# Patient Record
Sex: Male | Born: 1970 | Race: White | Hispanic: No | Marital: Married | Smoking: Current every day smoker
Health system: Southern US, Community
[De-identification: ages and names within clinical notes are randomized; demographics above are authoritative.]

## PROBLEM LIST (undated history)

## (undated) HISTORY — PX: TONSILLECTOMY: SUR1361

---

## 1999-07-31 ENCOUNTER — Inpatient Hospital Stay (HOSPITAL_COMMUNITY): Admission: EM | Admit: 1999-07-31 | Discharge: 1999-08-05 | Payer: Self-pay | Admitting: Emergency Medicine

## 2011-11-26 ENCOUNTER — Ambulatory Visit: Payer: Self-pay | Admitting: Physician Assistant

## 2013-08-28 ENCOUNTER — Ambulatory Visit: Payer: Self-pay | Admitting: Orthopedic Surgery

## 2013-09-01 LAB — WOUND CULTURE

## 2015-03-14 NOTE — Op Note (Signed)
PATIENT NAME:  Donald Henson, [name] MR#:  [id number] DATE OF BIRTH:  1971-08-04  DATE OF PROCEDURE:  08/28/2013  PREOPERATIVE DIAGNOSIS: Left middle finger abscess with foreign body.   POSTOPERATIVE DIAGNOSIS: Left middle finger abscess with foreign body.   PROCEDURE: Removal of foreign body and drainage of abscess, left little finger.   ANESTHESIA: Digital block with MAC.   SURGEON: Leitha SchullerMichael J. Lizza Huffaker, M.D.   DESCRIPTION OF PROCEDURE: The patient was brought to the operating room and after timeout procedure was completed and adequate IV sedation given, a digital block was given with 10 mL of 0.5% Sensorcaine plain injected into the area of the metacarpal neck, 10 mL on each radial and ulnar sides to provide analgesia for the procedure as well as postop analgesia. The hand was then prepped and draped in the usual sterile fashion. After patient identification and timeout procedures were completed, a Penrose drain was placed at the base of the finger as a tourniquet. Mini C-arm was used during the procedure to localize the foreign body. A small incision was made over the foreign body in the pulp and the subcutaneous tissue spread. There was purulent material present, and a culture was obtained of this material. Using fluoroscopy as a guide with a hemostat, the small metallic fragment was removed. On direct pressure to the wound, what appeared to be a wall to the abscess was expressed with fibrous tissue. The wound was thoroughly irrigated and left open to allow for drainage. The Penrose drain was removed, and there was minimal bleeding. There did not appear to be any further purulent material present, and there appeared to be adequate drainage of the abscess. Sterile dressing with Xeroform, 4 x 4's and a 1 inch Kling applied. The patient was then sent to the recovery room in stable condition.   ESTIMATED BLOOD LOSS: Minimal.   COMPLICATIONS: None.   SPECIMEN: Culture from the left middle finger abscess.     ____________________________ Leitha SchullerMichael J. Shaquel Josephson, MD mjm:gb D: 08/28/2013 21:09:42 ET T: 08/28/2013 22:13:14 ET JOB#: [id number]  cc: Leitha SchullerMichael J. Ambre Kobayashi, MD, <Dictator> Leitha SchullerMICHAEL J Norine Reddington MD ELECTRONICALLY SIGNED 08/29/2013 8:03

## 2017-07-26 ENCOUNTER — Emergency Department
Admission: EM | Admit: 2017-07-26 | Discharge: 2017-07-26 | Disposition: A | Discharge status: Home / Self Care | Payer: Worker's Compensation | Attending: Emergency Medicine | Admitting: Emergency Medicine

## 2017-07-26 ENCOUNTER — Emergency Department: Payer: Worker's Compensation

## 2017-07-26 ENCOUNTER — Encounter: Payer: Self-pay | Admitting: Emergency Medicine

## 2017-07-26 DIAGNOSIS — S6710XA Crushing injury of unspecified finger(s), initial encounter: Secondary | ICD-10-CM

## 2017-07-26 DIAGNOSIS — Y929 Unspecified place or not applicable: Secondary | ICD-10-CM | POA: Insufficient documentation

## 2017-07-26 DIAGNOSIS — Y939 Activity, unspecified: Secondary | ICD-10-CM | POA: Insufficient documentation

## 2017-07-26 DIAGNOSIS — F17200 Nicotine dependence, unspecified, uncomplicated: Secondary | ICD-10-CM | POA: Diagnosis not present

## 2017-07-26 DIAGNOSIS — S61214A Laceration without foreign body of right ring finger without damage to nail, initial encounter: Secondary | ICD-10-CM | POA: Diagnosis not present

## 2017-07-26 DIAGNOSIS — S67196A Crushing injury of right little finger, initial encounter: Secondary | ICD-10-CM | POA: Diagnosis present

## 2017-07-26 DIAGNOSIS — W231XXA Caught, crushed, jammed, or pinched between stationary objects, initial encounter: Secondary | ICD-10-CM | POA: Diagnosis not present

## 2017-07-26 DIAGNOSIS — S62666A Nondisplaced fracture of distal phalanx of right little finger, initial encounter for closed fracture: Secondary | ICD-10-CM | POA: Insufficient documentation

## 2017-07-26 DIAGNOSIS — Y990 Civilian activity done for income or pay: Secondary | ICD-10-CM | POA: Insufficient documentation

## 2017-07-26 DIAGNOSIS — S61316A Laceration without foreign body of right little finger with damage to nail, initial encounter: Secondary | ICD-10-CM

## 2017-07-26 DIAGNOSIS — S62639B Displaced fracture of distal phalanx of unspecified finger, initial encounter for open fracture: Secondary | ICD-10-CM

## 2017-07-26 MED ORDER — BUPIVACAINE HCL (PF) 0.5 % IJ SOLN
20.0000 mL | Freq: Once | INTRAMUSCULAR | Status: AC
  Administered 2017-07-26: 20 mL
  Filled 2017-07-26: qty 30

## 2017-07-26 MED ORDER — TETANUS-DIPHTH-ACELL PERTUSSIS 5-2.5-18.5 LF-MCG/0.5 IM SUSP
0.5000 mL | Freq: Once | INTRAMUSCULAR | Status: AC
  Administered 2017-07-26: 0.5 mL via INTRAMUSCULAR

## 2017-07-26 MED ORDER — TETANUS-DIPHTHERIA TOXOIDS TD 5-2 LFU IM INJ
0.5000 mL | INJECTION | Freq: Once | INTRAMUSCULAR | Status: DC
  Filled 2017-07-26: qty 0.5

## 2017-07-26 MED ORDER — HYDROCODONE-ACETAMINOPHEN 5-325 MG PO TABS
1.0000 | ORAL_TABLET | Freq: Once | ORAL | Status: AC
  Administered 2017-07-26: 1 via ORAL
  Filled 2017-07-26: qty 1

## 2017-07-26 MED ORDER — CEPHALEXIN 500 MG PO CAPS
500.0000 mg | ORAL_CAPSULE | Freq: Once | ORAL | Status: AC
  Administered 2017-07-26: 500 mg via ORAL
  Filled 2017-07-26: qty 1

## 2017-07-26 MED ORDER — CEPHALEXIN 500 MG PO CAPS: 500.0000 mg | ORAL_CAPSULE | Freq: Four times a day (QID) | ORAL | 0 refills | Status: AC

## 2017-07-26 MED ORDER — LIDOCAINE HCL (PF) 1 % IJ SOLN
5.0000 mL | Freq: Once | INTRAMUSCULAR | Status: AC
  Administered 2017-07-26: 5 mL via INTRADERMAL
  Filled 2017-07-26: qty 5

## 2017-07-26 MED ORDER — TETANUS-DIPHTH-ACELL PERTUSSIS 5-2.5-18.5 LF-MCG/0.5 IM SUSP
INTRAMUSCULAR | Status: AC
  Filled 2017-07-26: qty 0.5

## 2017-07-26 MED ORDER — BACITRACIN ZINC 500 UNIT/GM EX OINT
1.0000 | TOPICAL_OINTMENT | Freq: Two times a day (BID) | CUTANEOUS | Status: DC
Start: 2017-07-26 — End: 2017-07-27
  Administered 2017-07-26: 1 via TOPICAL
  Filled 2017-07-26: qty 0.9

## 2017-07-26 MED ORDER — HYDROCODONE-ACETAMINOPHEN 5-325 MG PO TABS: 1.0000 | ORAL_TABLET | ORAL | 0 refills | Status: AC | PRN

## 2017-07-26 NOTE — Discharge Instructions (Signed)
It is very important that you take the antibiotic and follow up with the hand specialist.  Do not get the sutured area wet for 24 hours. After 24 hours, shower/bathe as usual and pat the area dry. Change the bandage 2 times per day and apply antibiotic ointment. Wear the splint until you are advised otherwise by the specialist.  Return to the ER for symptoms that change or worsen if unable to schedule an appointment.

## 2017-07-26 NOTE — ED Triage Notes (Signed)
Patient cut his finger on a mechanical truck left at work. Patient with laceration to right fifth finger.

## 2017-07-26 NOTE — ED Notes (Signed)
UDS and breath analysis completed. Walked down to lab and paperwork placed on chart.

## 2017-07-26 NOTE — ED Provider Notes (Signed)
[facility] Emergency Department Provider Note  ____________________________________________  Time seen: Approximately 7:26 PM  I have reviewed the triage vital signs and the nursing notes.   HISTORY  Chief Complaint Laceration   HPI Donald Henson is a 46 y.o. male who presents to the emergency department for treatment and evaluation of an injury to his pinky finger on his right hand.He states that he cut it his finger on a mechanical truck lift at work. Tip of finger is nearly off per report.   History reviewed. No pertinent past medical history.  There are no active problems to display for this patient.   History reviewed. No pertinent surgical history.  Prior to Admission medications   Medication Sig Start Date End Date Taking? Authorizing Provider  cephALEXin (KEFLEX) 500 MG capsule Take 1 capsule (500 mg total) by mouth 4 (four) times daily. 07/26/17 08/05/17  Haron Beilke, Rulon Eisenmengerari B, FNP  HYDROcodone-acetaminophen (NORCO/VICODIN) 5-325 MG tablet Take 1 tablet by mouth every 4 (four) hours as needed for moderate pain. 07/26/17 07/26/18  Kem Boroughsriplett, Dallen Bunte B, FNP    Allergies Sulfa antibiotics  No family history on file.  Social History Social History  Substance Use Topics  . Smoking status: Current Every Day Smoker  . Smokeless tobacco: Never Used  . Alcohol use Not on file    Review of Systems  Constitutional: Negative for fever. Respiratory: Negative for cough or shortness of breath.  Musculoskeletal: Positive for right hand pain. Skin: Positive for laceration Neurological: Negative for numbness or paresthesias. ____________________________________________   PHYSICAL EXAM:  VITAL SIGNS: ED Triage Vitals  Enc Vitals Group     BP 07/26/17 1915 (!) 147/90     Pulse Rate 07/26/17 1915 91     Resp 07/26/17 1915 18     Temp 07/26/17 1915 98.1 F (36.7 C)     Temp Source 07/26/17 1915 Oral     SpO2 07/26/17 1915 100 %     Weight 07/26/17 1916  182 lb (82.6 kg)     Height 07/26/17 1916 5\' 9"  (1.753 m)     Head Circumference --      Peak Flow --      Pain Score 07/26/17 1915 4     Pain Loc --      Pain Edu? --      Excl. in GC? --      Constitutional: Well appearing. Eyes: Conjunctivae are clear without discharge or drainage. Nose: No rhinorrhea noted. Mouth/Throat: Airway is patent.  Neck: No stridor. Unrestricted range of motion observed.  Cardiovascular: Capillary refill is <3 seconds.  Respiratory: Respirations are even and unlabored.. Musculoskeletal: Right small fingertip nearly avulsed from under nail to the lateral aspect just above DIP. Hand and fingers have full ROM. Neurologic: Awake, alert, and oriented x 4.  Skin:  Laceration to right 5th fingertip.  ____________________________________________   LABS (all labs ordered are listed, but only abnormal results are displayed)  Labs Reviewed - No data to display ____________________________________________  EKG  Not indicated. ____________________________________________  RADIOLOGY  Comminuted fracture distal aspect fifth distal phalanx with extensive soft tissue injury. I, Kem Boroughsari Westyn Keatley, personally viewed and evaluated these images (plain radiographs) as part of my medical decision making, as well as reviewing the written report by the radiologist.  ___________________________________________   PROCEDURES  Procedure(s) performed:  LACERATION REPAIR Performed by: Kem Boroughsari Mal Asher  Consent: Verbal consent obtained.  Consent given by: patient  Prepped and Draped in normal sterile fashion  Wound explored: No  foreign bodies identified  Laceration Location: right pinky finger  Laceration Length: 3cm  Anesthesia: digital block  Local anesthetic: lidocaine 1% with bupivicaine 0.5  Anesthetic total: 6 ml  Irrigation method: syringe  Amount of cleaning: Copious  Skin closure: simple interrupted.  Number of sutures: 8  Technique: 4-0  vicryl; 5-0 nylon  Patient tolerance: Patient tolerated the procedure well with no immediate complications.    ____________________________________________   INITIAL IMPRESSION / ASSESSMENT AND PLAN / ED COURSE  Donald Henson is a 46 y.o. male who presents to the emergency department for evaluation and treatment of dominant hand crush injury and laceration on the small finger. Wound was repaired and tetanus was updated. He is to take keflex as prescribed. He was given strict instructions to follow up with Dr. Stephenie Acres and advised that he will need follow up to make sure the fingertip is viable.   Pertinent labs & imaging results that were available during my care of the patient were reviewed by me and considered in my medical decision making (see chart for details). ____________________________________________   FINAL CLINICAL IMPRESSION(S) / ED DIAGNOSES  Final diagnoses:  Laceration of right little finger without foreign body with damage to nail, initial encounter  Crush injury to finger, initial encounter  Open fracture of tuft of distal phalanx of finger    Discharge Medication List as of 07/26/2017  9:24 PM    START taking these medications   Details  cephALEXin (KEFLEX) 500 MG capsule Take 1 capsule (500 mg total) by mouth 4 (four) times daily., Starting Tue 07/26/2017, Until Fri 08/05/2017, Print    HYDROcodone-acetaminophen (NORCO/VICODIN) 5-325 MG tablet Take 1 tablet by mouth every 4 (four) hours as needed for moderate pain., Starting Tue 07/26/2017, Until Wed 07/26/2018, Print        If controlled substance prescribed during this visit, 12 month history viewed on the [facility] prior to issuing an initial prescription for Schedule II or III opiod.   Note:  This document was prepared using Dragon voice recognition software and may include unintentional dictation errors.    Chinita Pester, FNP 07/26/17 [phone]    Phineas Semen, MD 07/26/17 [phone]

## 2018-08-04 IMAGING — DX DG FINGER LITTLE 2+V*R*
3 series · 3 of 3 positions shown · non-contrast
Comparison: None.

CLINICAL DATA: Laceration on piece of metal

EXAM:
RIGHT FIFTH FINGER 2+V

[finger ap]
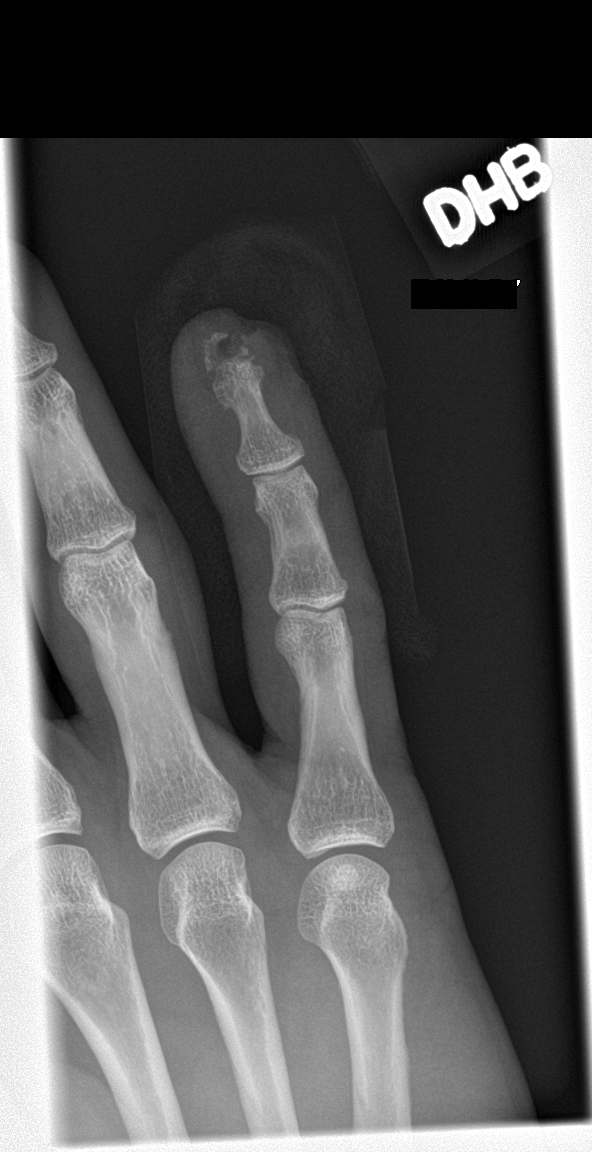

[finger obl]
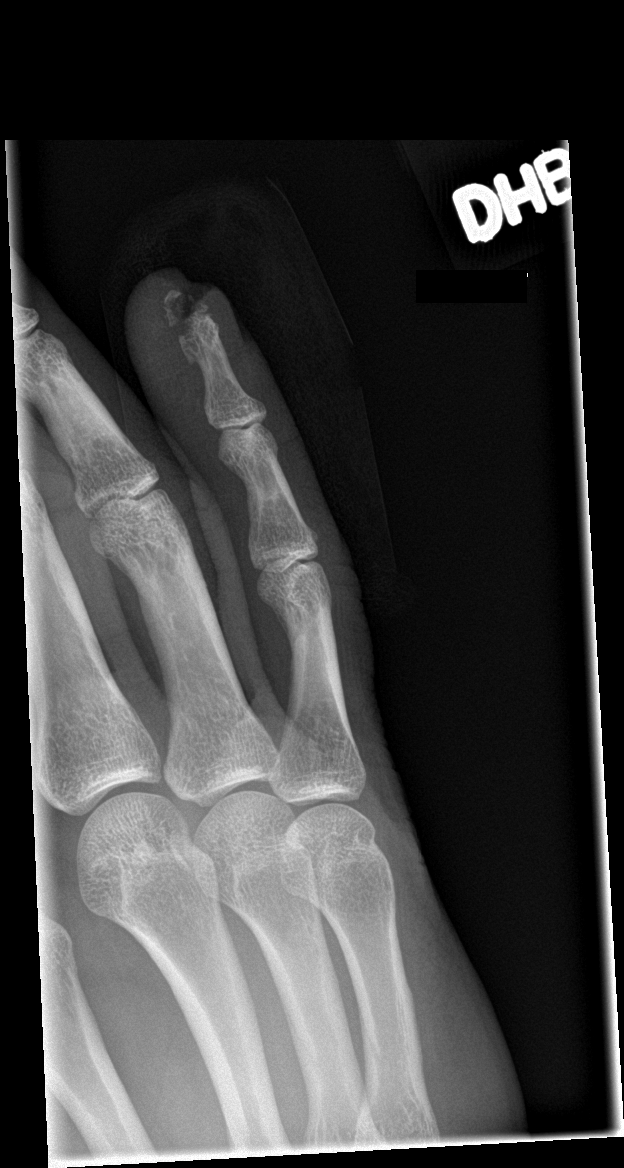

[finger lat]
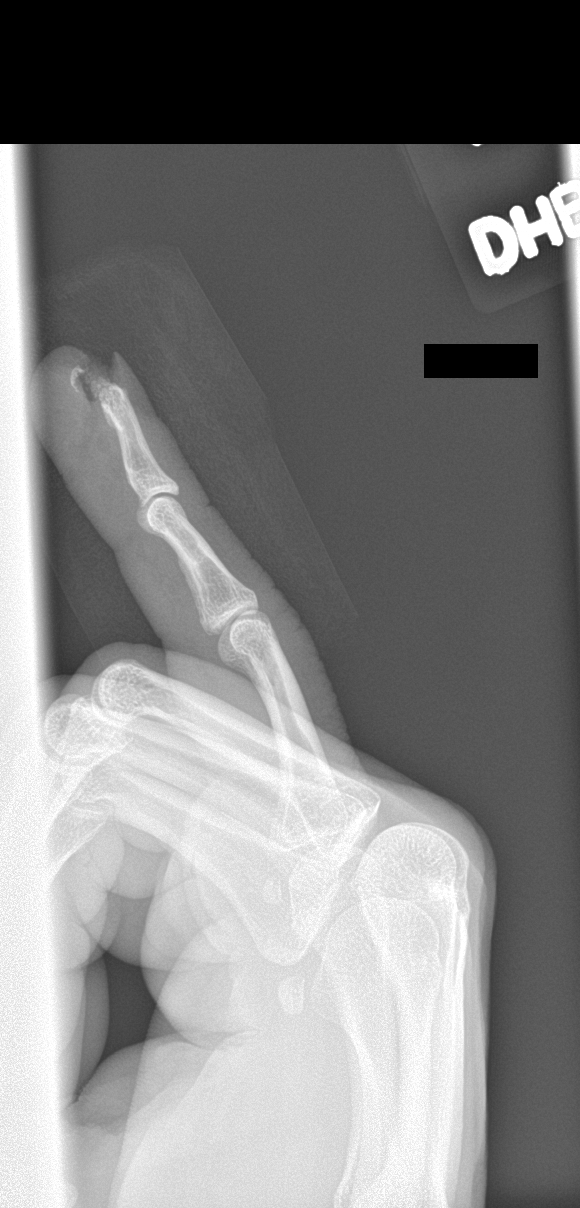

[3 of 3 positions shown; findings below may reference images not displayed]

FINDINGS: Frontal, oblique, and lateral views were obtained. There is soft
tissue injury to the distal aspect of the fifth digit. There is a
comminuted fracture of the distal aspect of the fifth distal phalanx
with displacement of fracture fragments. There is ungual injury as
well. No other fractures. No dislocation. No appreciable joint space
narrowing or erosion.
IMPRESSION: Comminuted fracture distal aspect fifth distal phalanx with
extensive soft tissue injury. No other fracture. No dislocation. No
appreciable arthropathy.

## 2021-04-22 DEATH — deceased

## 2024-10-15 ENCOUNTER — Ambulatory Visit: Payer: Self-pay | Admitting: Surgery

## 2024-10-15 NOTE — H&P (View-Only) (Signed)
 Subjective:   CC: Umbilical hernia with obstruction, without gangrene [K42.0]  HPI: referred by Jonette Penning Minor,* for evaluation of above.   History of Present Illness Donald Henson is a 53 year old male who presents with a belly button hernia. He was referred by his primary care doctor for evaluation of his belly button hernia.  He has a persistent umbilical hernia that has become increasingly irritated and red. It has never fully reduced. He has had no prior abdominal surgeries.  He smokes about half a pack per day and drinks a twelve-pack of alcohol on weekends. He does not use recreational drugs.  He takes daily allergy medication. He has coughing spells that can cause gagging and vomiting.   Past Medical History:  has no past medical history on file.  Past Surgical History: No past surgical history on file.  Family History: family history includes No Known Problems in his father and mother.  Social History:  reports that he has been smoking cigarettes. He has never used smokeless tobacco. He reports that he does not currently use alcohol after a past usage of about 12.0 standard drinks of alcohol per week. He reports that he does not use drugs.  Current Medications: has a current medication list which includes the following prescription(s): amlodipine, losartan-hydrochlorothiazide, mupirocin, and rosuvastatin.  Allergies:  Allergies as of 10/15/2024 - Reviewed 10/15/2024  Allergen Reaction Noted   Sulfa (sulfonamide antibiotics) Swelling 10/12/2016    ROS:  A 15 point review of systems was performed and pertinent positives and negatives noted in HPI   Objective:     BP (!) 157/84   Pulse 85   Ht 172.7 cm (5' 8)   Wt 96.2 kg (212 lb)   BMI 32.23 kg/m   Constitutional :  Alert, cooperative, no distress  Lymphatics/Throat:  Supple, no lymphadenopathy  Respiratory:  clear to auscultation bilaterally  Cardiovascular:  regular rate and rhythm   Gastrointestinal: soft, non-tender; bowel sounds normal; no masses,  no organomegaly. umbilical hernia noted.  moderate, incarcerated, and irritated skin  Musculoskeletal: Steady gait and movement  Skin: Cool and moist  Psychiatric: Normal affect, non-agitated, not confused       LABS:  N/a   RADS: N/a Assessment:       Umbilical hernia with obstruction, without gangrene [K42.0]  Plan:     1. Umbilical hernia with obstruction, without gangrene [K42.0]   Discussed the risk of surgery including recurrence, which can be up to 50% in the case of incisional or complex hernias, possible use of prosthetic materials (mesh) and the increased risk of mesh infxn if used, bleeding, chronic pain, post-op infxn, post-op SBO or ileus, and possible re-operation to address said risks. The risks of general anesthetic, if used, includes MI, CVA, sudden death or even reaction to anesthetic medications also discussed. Alternatives include continued observation.  Benefits include possible symptom relief, prevention of incarceration, strangulation, enlargement in size over time, and the risk of emergency surgery in the face of strangulation.   Typical post-op recovery time of 3-5 days with 2 weeks of activity restrictions were also discussed.  ED return precautions given for sudden increase in pain, size of hernia with accompanying fever, nausea, and/or vomiting.  The patient verbalized understanding and all questions were answered to the patient's satisfaction.   2. Patient has elected to proceed with surgical treatment. Procedure will be scheduled.    Umbilical hernia with obstruction, without gangrene [K42.0], robotic assisted laparoscopic [phone]  labs/images/medications/previous chart entries reviewed  personally and relevant changes/updates noted above.

## 2024-10-15 NOTE — H&P (Signed)
 Subjective:   CC: Umbilical hernia with obstruction, without gangrene [K42.0]  HPI: referred by Jonette Penning Minor,* for evaluation of above.   History of Present Illness Donald Henson is a 53 year old male who presents with a belly button hernia. He was referred by his primary care doctor for evaluation of his belly button hernia.  He has a persistent umbilical hernia that has become increasingly irritated and red. It has never fully reduced. He has had no prior abdominal surgeries.  He smokes about half a pack per day and drinks a twelve-pack of alcohol on weekends. He does not use recreational drugs.  He takes daily allergy medication. He has coughing spells that can cause gagging and vomiting.   Past Medical History:  has no past medical history on file.  Past Surgical History: No past surgical history on file.  Family History: family history includes No Known Problems in his father and mother.  Social History:  reports that he has been smoking cigarettes. He has never used smokeless tobacco. He reports that he does not currently use alcohol after a past usage of about 12.0 standard drinks of alcohol per week. He reports that he does not use drugs.  Current Medications: has a current medication list which includes the following prescription(s): amlodipine, losartan-hydrochlorothiazide, mupirocin, and rosuvastatin.  Allergies:  Allergies as of 10/15/2024 - Reviewed 10/15/2024  Allergen Reaction Noted   Sulfa (sulfonamide antibiotics) Swelling 10/12/2016    ROS:  A 15 point review of systems was performed and pertinent positives and negatives noted in HPI   Objective:     BP (!) 157/84   Pulse 85   Ht 172.7 cm (5' 8)   Wt 96.2 kg (212 lb)   BMI 32.23 kg/m   Constitutional :  Alert, cooperative, no distress  Lymphatics/Throat:  Supple, no lymphadenopathy  Respiratory:  clear to auscultation bilaterally  Cardiovascular:  regular rate and rhythm   Gastrointestinal: soft, non-tender; bowel sounds normal; no masses,  no organomegaly. umbilical hernia noted.  moderate, incarcerated, and irritated skin  Musculoskeletal: Steady gait and movement  Skin: Cool and moist  Psychiatric: Normal affect, non-agitated, not confused       LABS:  N/a   RADS: N/a Assessment:       Umbilical hernia with obstruction, without gangrene [K42.0]  Plan:     1. Umbilical hernia with obstruction, without gangrene [K42.0]   Discussed the risk of surgery including recurrence, which can be up to 50% in the case of incisional or complex hernias, possible use of prosthetic materials (mesh) and the increased risk of mesh infxn if used, bleeding, chronic pain, post-op infxn, post-op SBO or ileus, and possible re-operation to address said risks. The risks of general anesthetic, if used, includes MI, CVA, sudden death or even reaction to anesthetic medications also discussed. Alternatives include continued observation.  Benefits include possible symptom relief, prevention of incarceration, strangulation, enlargement in size over time, and the risk of emergency surgery in the face of strangulation.   Typical post-op recovery time of 3-5 days with 2 weeks of activity restrictions were also discussed.  ED return precautions given for sudden increase in pain, size of hernia with accompanying fever, nausea, and/or vomiting.  The patient verbalized understanding and all questions were answered to the patient's satisfaction.   2. Patient has elected to proceed with surgical treatment. Procedure will be scheduled.    Umbilical hernia with obstruction, without gangrene [K42.0], robotic assisted laparoscopic [phone]  labs/images/medications/previous chart entries reviewed  personally and relevant changes/updates noted above.

## 2024-11-02 ENCOUNTER — Other Ambulatory Visit: Payer: Self-pay

## 2024-11-02 ENCOUNTER — Inpatient Hospital Stay: Admission: RE | Admit: 2024-11-02 | Discharge: 2024-11-02 | Payer: Self-pay | Attending: Surgery

## 2024-11-02 VITALS — Ht 68.0 in | Wt 210.0 lb

## 2024-11-02 DIAGNOSIS — Z79899 Other long term (current) drug therapy: Secondary | ICD-10-CM

## 2024-11-02 DIAGNOSIS — I10 Essential (primary) hypertension: Secondary | ICD-10-CM

## 2024-11-02 NOTE — Patient Instructions (Addendum)
 Your procedure is scheduled on: Friday 11/09/24 Report to the Registration Desk on the 1st floor of the [facility]. To find out your arrival time, please call [phone] between 1PM - 3PM on: Thursday 11/08/24 If your arrival time is 6:00 am, do not arrive before that time as the [facility] entrance doors do not open until 6:00 am.  REMEMBER: Instructions that are not followed completely may result in serious medical risk, up to and including death; or upon the discretion of your surgeon and anesthesiologist your surgery may need to be rescheduled.  Do not eat food after midnight the night before surgery.  No gum chewing or hard candies.  You may however, drink CLEAR liquids up to 2 hours before you are scheduled to arrive for your surgery. Do not drink anything within 2 hours of your scheduled arrival time.  Clear liquids include: - water  - apple juice without pulp - gatorade (not RED colors) - black coffee or tea (Do NOT add milk or creamers to the coffee or tea) Do NOT drink anything that is not on this list.  One week prior to surgery: Stop Anti-inflammatories (NSAIDS) such as Advil, Aleve, Ibuprofen, Motrin, Naproxen, Naprosyn and Aspirin based products such as Excedrin, Goody's Powder, BC Powder.  You may however, continue to take Tylenol  if needed for pain up until the day of surgery.  Stop ANY OVER THE COUNTER supplements and vitamins until after surgery.  Continue taking all of your other prescription medications up until the day of surgery.  ON THE DAY OF SURGERY ONLY TAKE THESE MEDICATIONS WITH SIPS OF WATER:  amLODipine (NORVASC) 10 MG tablet  cetirizine (ZYRTEC) 10 MG tablet   No Alcohol for 24 hours before or after surgery.  No Smoking including e-cigarettes for 24 hours before surgery.  No chewable tobacco products for at least 6 hours before surgery.  No nicotine patches on the day of surgery.  Do not use any recreational drugs for at least a week  (preferably 2 weeks) before your surgery.  Please be advised that the combination of cocaine and anesthesia may have negative outcomes, up to and including death. If you test positive for cocaine, your surgery will be cancelled.  On the morning of surgery brush your teeth with toothpaste and water, you may rinse your mouth with mouthwash if you wish. Do not swallow any toothpaste or mouthwash.  Use CHG Soap or wipes as directed on instruction sheet.  Do not shave body hair from the neck down 48 hours before surgery.  Do not wear lotions, powders, or perfumes.   Wear comfortable clothing (specific to your surgery type) to the hospital.  Do not wear jewelry, make-up, hairpins, clips or nail polish.  For welded (permanent) jewelry: bracelets, anklets, waist bands, etc.  Please have this removed prior to surgery.  If it is not removed, there is a chance that hospital personnel will need to cut it off on the day of surgery.  Contact lenses, hearing aids and dentures may not be worn into surgery.  Do not bring valuables to the hospital. [facility] is not responsible for any missing/lost belongings or valuables.   Notify your doctor if there is any change in your medical condition (cold, fever, infection).  After surgery, you can help prevent lung complications by doing breathing exercises.  Take deep breaths and cough every 1-2 hours. Your doctor may order a device called an Incentive Spirometer to help you take deep breaths.  When coughing  or sneezing, hold a pillow firmly against your incision with both hands. This is called splinting. Doing this helps protect your incision. It also decreases belly discomfort.  If you are being discharged the day of surgery, you will not be allowed to drive home. You will need a responsible individual to drive you home and stay with you for 24 hours after surgery.   Please call the Pre-admissions Testing Dept. at [phone] if you have any  questions about these instructions.  Surgery Visitation Policy:  Patients having surgery or a procedure may have two visitors.  Children under the age of 49 must have an adult with them who is not the patient.   Merchandiser, Retail to address health-related social needs:  [email or web address]                                                                                                             Preparing for Surgery with CHLORHEXIDINE GLUCONATE (CHG) Soap  Chlorhexidine Gluconate (CHG) Soap  o An antiseptic cleaner that kills germs and bonds with the skin to continue killing germs even after washing  o Used for showering the night before surgery and morning of surgery  Before surgery, you can play an important role by reducing the number of germs on your skin.  CHG (Chlorhexidine gluconate) soap is an antiseptic cleanser which kills germs and bonds with the skin to continue killing germs even after washing.  Please do not use if you have an allergy to CHG or antibacterial soaps. If your skin becomes reddened/irritated stop using the CHG.  1. Shower the NIGHT BEFORE SURGERY with CHG soap.  2. If you choose to wash your hair, wash your hair first as usual with your normal shampoo.  3. After shampooing, rinse your hair and body thoroughly to remove the shampoo.  4. Use CHG as you would any other liquid soap. You can apply CHG directly to the skin and wash gently with a clean washcloth.  5. Apply the CHG soap to your body only from the neck down. Do not use on open wounds or open sores. Avoid contact with your eyes, ears, mouth, and genitals (private parts). Wash face and genitals (private parts) with your normal soap.  6. Wash thoroughly, paying special attention to the area where your surgery will be performed.  7. Thoroughly rinse your body with warm water.  8. Do not shower/wash with your normal soap after using and rinsing off the CHG soap.  9. Do  not use lotions, oils, etc., after showering with CHG.  10. Pat yourself dry with a clean towel.  11. Wear clean pajamas to bed the night before surgery.  12. Place clean sheets on your bed the night of your shower and do not sleep with pets.  13. Do not apply any deodorants/lotions/powders.  14. Please wear clean clothes to the hospital.  15. Remember to brush your teeth with your regular toothpaste.

## 2024-11-06 ENCOUNTER — Encounter: Payer: Self-pay | Admitting: Urgent Care

## 2024-11-06 ENCOUNTER — Inpatient Hospital Stay: Admission: RE | Admit: 2024-11-06 | Discharge: 2024-11-06 | Payer: Self-pay | Attending: Surgery

## 2024-11-06 DIAGNOSIS — Z79899 Other long term (current) drug therapy: Secondary | ICD-10-CM | POA: Diagnosis not present

## 2024-11-06 DIAGNOSIS — I10 Essential (primary) hypertension: Secondary | ICD-10-CM | POA: Insufficient documentation

## 2024-11-06 DIAGNOSIS — F17210 Nicotine dependence, cigarettes, uncomplicated: Secondary | ICD-10-CM | POA: Diagnosis not present

## 2024-11-06 DIAGNOSIS — K420 Umbilical hernia with obstruction, without gangrene: Secondary | ICD-10-CM | POA: Diagnosis present

## 2024-11-06 DIAGNOSIS — Z01818 Encounter for other preprocedural examination: Secondary | ICD-10-CM | POA: Insufficient documentation

## 2024-11-06 LAB — BASIC METABOLIC PANEL WITH GFR
Anion gap: 14 (ref 5–15)
BUN: 16 mg/dL (ref 6–20)
CO2: 22 mmol/L (ref 22–32)
Calcium: 9.8 mg/dL (ref 8.9–10.3)
Chloride: 97 mmol/L — ABNORMAL LOW (ref 98–111)
Creatinine, Ser: 0.85 mg/dL (ref 0.61–1.24)
GFR, Estimated: >60 mL/min (ref >60)
Glucose, Bld: 93 mg/dL (ref 70–99)
Potassium: 3.9 mmol/L (ref 3.5–5.1)
Sodium: 132 mmol/L — ABNORMAL LOW (ref 135–145)

## 2024-11-06 LAB — CBC
HCT: 41.6 % (ref 39.0–52.0)
Hemoglobin: 14.3 g/dL (ref 13.0–17.0)
MCH: 30.8 pg (ref 26.0–34.0)
MCHC: 34.4 g/dL (ref 30.0–36.0)
MCV: 89.7 fL (ref 80.0–100.0)
Platelets: 188 K/uL (ref 150–400)
RBC: 4.64 MIL/uL (ref 4.22–5.81)
RDW: 12.9 % (ref 11.5–15.5)
WBC: 5.5 K/uL (ref 4.0–10.5)
nRBC: 0 % (ref 0.0–0.2)

## 2024-11-09 ENCOUNTER — Other Ambulatory Visit: Payer: Self-pay

## 2024-11-09 ENCOUNTER — Encounter: Admission: RE | Disposition: A | Payer: Self-pay | Attending: Surgery

## 2024-11-09 ENCOUNTER — Encounter: Payer: Self-pay | Admitting: Surgery

## 2024-11-09 ENCOUNTER — Ambulatory Visit
Admission: RE | Admit: 2024-11-09 | Discharge: 2024-11-09 | Disposition: A | Payer: Self-pay | Attending: Surgery | Admitting: Surgery

## 2024-11-09 ENCOUNTER — Ambulatory Visit: Admitting: General Practice

## 2024-11-09 DIAGNOSIS — K420 Umbilical hernia with obstruction, without gangrene: Secondary | ICD-10-CM | POA: Diagnosis not present

## 2024-11-09 DIAGNOSIS — Z79899 Other long term (current) drug therapy: Secondary | ICD-10-CM | POA: Insufficient documentation

## 2024-11-09 DIAGNOSIS — F17210 Nicotine dependence, cigarettes, uncomplicated: Secondary | ICD-10-CM | POA: Insufficient documentation

## 2024-11-09 DIAGNOSIS — I10 Essential (primary) hypertension: Secondary | ICD-10-CM | POA: Insufficient documentation

## 2024-11-09 HISTORY — PX: INSERTION OF MESH: SHX5868

## 2024-11-09 SURGERY — REPAIR, HERNIA, UMBILICAL, ROBOT-ASSISTED
Anesthesia: General

## 2024-11-09 MED ORDER — GABAPENTIN 300 MG PO CAPS
300.0000 mg | ORAL_CAPSULE | ORAL | Status: AC
  Administered 2024-11-09: 300 mg via ORAL

## 2024-11-09 MED ORDER — OXYCODONE HCL 5 MG/5ML PO SOLN: 5.0000 mg | Freq: Once | ORAL | Status: AC | PRN

## 2024-11-09 MED ORDER — BUPIVACAINE-EPINEPHRINE (PF) 0.5% -1:200000 IJ SOLN
INTRAMUSCULAR | Status: AC
  Filled 2024-11-09: qty 30

## 2024-11-09 MED ORDER — FENTANYL CITRATE (PF) 100 MCG/2ML IJ SOLN
INTRAMUSCULAR | Status: AC
  Filled 2024-11-09: qty 2

## 2024-11-09 MED ORDER — CHLORHEXIDINE GLUCONATE 0.12 % MT SOLN
OROMUCOSAL | Status: AC
  Filled 2024-11-09: qty 15

## 2024-11-09 MED ORDER — PHENYLEPHRINE 80 MCG/ML (10ML) SYRINGE FOR IV PUSH (FOR BLOOD PRESSURE SUPPORT)
PREFILLED_SYRINGE | INTRAVENOUS | Status: DC | PRN
  Administered 2024-11-09: 120 ug via INTRAVENOUS

## 2024-11-09 MED ORDER — OXYCODONE HCL 5 MG PO TABS
5.0000 mg | ORAL_TABLET | Freq: Once | ORAL | Status: AC | PRN
  Administered 2024-11-09: 5 mg via ORAL

## 2024-11-09 MED ORDER — DOCUSATE SODIUM 100 MG PO CAPS
100.0000 mg | ORAL_CAPSULE | Freq: Two times a day (BID) | ORAL | 0 refills | Status: AC | PRN
  Filled 2024-11-09: qty 20, 10d supply, fill #0

## 2024-11-09 MED ORDER — ONDANSETRON HCL 4 MG/2ML IJ SOLN
INTRAMUSCULAR | Status: AC
  Filled 2024-11-09: qty 2

## 2024-11-09 MED ORDER — SUGAMMADEX SODIUM 200 MG/2ML IV SOLN
INTRAVENOUS | Status: DC | PRN
  Administered 2024-11-09: 200 mg via INTRAVENOUS

## 2024-11-09 MED ORDER — PROPOFOL 10 MG/ML IV BOLUS
INTRAVENOUS | Status: DC | PRN
  Administered 2024-11-09: 200 mg via INTRAVENOUS

## 2024-11-09 MED ORDER — PROPOFOL 500 MG/50ML IV EMUL
INTRAVENOUS | Status: DC | PRN
  Administered 2024-11-09: 175 ug/kg/min via INTRAVENOUS

## 2024-11-09 MED ORDER — ROCURONIUM BROMIDE 10 MG/ML (PF) SYRINGE
PREFILLED_SYRINGE | INTRAVENOUS | Status: AC
  Filled 2024-11-09: qty 10

## 2024-11-09 MED ORDER — DEXMEDETOMIDINE HCL IN NACL 80 MCG/20ML IV SOLN
INTRAVENOUS | Status: DC | PRN
  Administered 2024-11-09 (×2): 10 ug via INTRAVENOUS
  Administered 2024-11-09: 8 ug via INTRAVENOUS

## 2024-11-09 MED ORDER — MIDAZOLAM HCL 2 MG/2ML IJ SOLN
INTRAMUSCULAR | Status: AC
  Filled 2024-11-09: qty 2

## 2024-11-09 MED ORDER — LACTATED RINGERS IV SOLN: INTRAVENOUS | Status: DC

## 2024-11-09 MED ORDER — ESMOLOL HCL 100 MG/10ML IV SOLN
INTRAVENOUS | Status: DC | PRN
  Administered 2024-11-09: 20 mg via INTRAVENOUS
  Administered 2024-11-09: 10 mg via INTRAVENOUS
  Administered 2024-11-09: 20 mg via INTRAVENOUS

## 2024-11-09 MED ORDER — BUPIVACAINE-EPINEPHRINE 0.5% -1:200000 IJ SOLN
INTRAMUSCULAR | Status: DC | PRN
  Administered 2024-11-09: 17 mL

## 2024-11-09 MED ORDER — LIDOCAINE HCL (PF) 2 % IJ SOLN
INTRAMUSCULAR | Status: AC
  Filled 2024-11-09: qty 5

## 2024-11-09 MED ORDER — ACETAMINOPHEN 500 MG PO TABS
1000.0000 mg | ORAL_TABLET | ORAL | Status: AC
  Administered 2024-11-09: 1000 mg via ORAL

## 2024-11-09 MED ORDER — MIDAZOLAM HCL (PF) 2 MG/2ML IJ SOLN
INTRAMUSCULAR | Status: DC | PRN
  Administered 2024-11-09: 2 mg via INTRAVENOUS

## 2024-11-09 MED ORDER — CEFAZOLIN SODIUM-DEXTROSE 2-4 GM/100ML-% IV SOLN
INTRAVENOUS | Status: AC
  Filled 2024-11-09: qty 100

## 2024-11-09 MED ORDER — ACETAMINOPHEN 500 MG PO TABS
ORAL_TABLET | ORAL | Status: AC
  Filled 2024-11-09: qty 2

## 2024-11-09 MED ORDER — PROPOFOL 1000 MG/100ML IV EMUL
INTRAVENOUS | Status: AC
  Filled 2024-11-09: qty 100

## 2024-11-09 MED ORDER — CEFAZOLIN SODIUM-DEXTROSE 2-4 GM/100ML-% IV SOLN
2.0000 g | INTRAVENOUS | Status: AC
  Administered 2024-11-09: 2 g via INTRAVENOUS

## 2024-11-09 MED ORDER — PHENYLEPHRINE 80 MCG/ML (10ML) SYRINGE FOR IV PUSH (FOR BLOOD PRESSURE SUPPORT)
PREFILLED_SYRINGE | INTRAVENOUS | Status: AC
  Filled 2024-11-09: qty 10

## 2024-11-09 MED ORDER — CHLORHEXIDINE GLUCONATE CLOTH 2 % EX PADS: 6.0000 | MEDICATED_PAD | Freq: Once | CUTANEOUS | Status: DC

## 2024-11-09 MED ORDER — LIDOCAINE HCL (CARDIAC) PF 100 MG/5ML IV SOSY
PREFILLED_SYRINGE | INTRAVENOUS | Status: DC | PRN
  Administered 2024-11-09: 100 mg via INTRAVENOUS

## 2024-11-09 MED ORDER — OXYCODONE HCL 5 MG PO TABS
5.0000 mg | ORAL_TABLET | Freq: Three times a day (TID) | ORAL | 0 refills | Status: AC | PRN
  Filled 2024-11-09: qty 6, 2d supply, fill #0

## 2024-11-09 MED ORDER — DEXAMETHASONE SOD PHOSPHATE PF 10 MG/ML IJ SOLN
INTRAMUSCULAR | Status: DC | PRN
  Administered 2024-11-09: 10 mg via INTRAVENOUS

## 2024-11-09 MED ORDER — CHLORHEXIDINE GLUCONATE 0.12 % MT SOLN
15.0000 mL | Freq: Once | OROMUCOSAL | Status: AC
  Administered 2024-11-09: 15 mL via OROMUCOSAL

## 2024-11-09 MED ORDER — FENTANYL CITRATE (PF) 100 MCG/2ML IJ SOLN
INTRAMUSCULAR | Status: DC | PRN
  Administered 2024-11-09: 100 ug via INTRAVENOUS
  Administered 2024-11-09 (×2): 50 ug via INTRAVENOUS

## 2024-11-09 MED ORDER — CELECOXIB 200 MG PO CAPS
200.0000 mg | ORAL_CAPSULE | ORAL | Status: AC
  Administered 2024-11-09: 200 mg via ORAL

## 2024-11-09 MED ORDER — GABAPENTIN 300 MG PO CAPS
ORAL_CAPSULE | ORAL | Status: AC
  Filled 2024-11-09: qty 1

## 2024-11-09 MED ORDER — CELECOXIB 200 MG PO CAPS
ORAL_CAPSULE | ORAL | Status: AC
  Filled 2024-11-09: qty 1

## 2024-11-09 MED ORDER — ALBUTEROL SULFATE HFA 108 (90 BASE) MCG/ACT IN AERS
INHALATION_SPRAY | RESPIRATORY_TRACT | Status: AC
  Filled 2024-11-09: qty 6.7

## 2024-11-09 MED ORDER — PROPOFOL 10 MG/ML IV BOLUS
INTRAVENOUS | Status: AC
  Filled 2024-11-09: qty 40

## 2024-11-09 MED ORDER — ORAL CARE MOUTH RINSE: 15.0000 mL | Freq: Once | OROMUCOSAL | Status: AC

## 2024-11-09 MED ORDER — ONDANSETRON HCL 4 MG/2ML IJ SOLN
INTRAMUSCULAR | Status: DC | PRN
  Administered 2024-11-09: 4 mg via INTRAVENOUS

## 2024-11-09 MED ORDER — ROCURONIUM BROMIDE 100 MG/10ML IV SOLN
INTRAVENOUS | Status: DC | PRN
  Administered 2024-11-09: 20 mg via INTRAVENOUS
  Administered 2024-11-09: 60 mg via INTRAVENOUS
  Administered 2024-11-09: 30 mg via INTRAVENOUS

## 2024-11-09 MED ORDER — FENTANYL CITRATE (PF) 100 MCG/2ML IJ SOLN
25.0000 ug | INTRAMUSCULAR | Status: DC | PRN
  Administered 2024-11-09: 25 ug via INTRAVENOUS

## 2024-11-09 MED ORDER — IBUPROFEN 800 MG PO TABS
800.0000 mg | ORAL_TABLET | Freq: Three times a day (TID) | ORAL | 0 refills | Status: AC | PRN
  Filled 2024-11-09: qty 30, 10d supply, fill #0

## 2024-11-09 MED ORDER — OXYCODONE HCL 5 MG PO TABS
ORAL_TABLET | ORAL | Status: AC
  Filled 2024-11-09: qty 1

## 2024-11-09 MED ORDER — ALBUTEROL SULFATE HFA 108 (90 BASE) MCG/ACT IN AERS
INHALATION_SPRAY | RESPIRATORY_TRACT | Status: DC | PRN
  Administered 2024-11-09: 4 via RESPIRATORY_TRACT

## 2024-11-09 SURGICAL SUPPLY — 39 items
COVER TIP SHEARS 8 DVNC (MISCELLANEOUS) ×2 IMPLANT
COVER WAND RF STERILE (DRAPES) ×2 IMPLANT
DEFOGGER SCOPE WARM SEASHARP (MISCELLANEOUS) ×2 IMPLANT
DERMABOND ADVANCED .7 DNX12 (GAUZE/BANDAGES/DRESSINGS) ×2 IMPLANT
DRAPE ARM DVNC X/XI (DISPOSABLE) ×6 IMPLANT
DRAPE COLUMN DVNC XI (DISPOSABLE) ×2 IMPLANT
ELECTRODE REM PT RTRN 9FT ADLT (ELECTROSURGICAL) ×2 IMPLANT
FORCEPS BPLR FENES DVNC XI (FORCEP) ×2 IMPLANT
GLOVE BIOGEL PI IND STRL 7.0 (GLOVE) ×4 IMPLANT
GLOVE SURG SYN 6.5 PF PI (GLOVE) ×8 IMPLANT
GOWN STRL REUS W/ TWL LRG LVL3 (GOWN DISPOSABLE) ×8 IMPLANT
GRASPER SUT TROCAR 14GX15 (MISCELLANEOUS) IMPLANT
IRRIGATOR SUCT 8 DISP DVNC XI (IRRIGATION / IRRIGATOR) IMPLANT
IV 0.9% NACL 1000 ML (IV SOLUTION) IMPLANT
LABEL OR SOLS (LABEL) ×2 IMPLANT
MANIFOLD NEPTUNE II (INSTRUMENTS) ×2 IMPLANT
MESH PROGRIP HERNIA FLAT 15X15 (Mesh General) IMPLANT
NDL DRIVE SUT CUT DVNC (INSTRUMENTS) ×2 IMPLANT
NDL HYPO 22X1.5 SAFETY MO (MISCELLANEOUS) ×2 IMPLANT
NDL INSUFFLATION 14GA 120MM (NEEDLE) ×2 IMPLANT
NEEDLE DRIVE SUT CUT DVNC (INSTRUMENTS) ×2 IMPLANT
NEEDLE HYPO 22X1.5 SAFETY MO (MISCELLANEOUS) ×2 IMPLANT
NEEDLE INSUFFLATION 14GA 120MM (NEEDLE) ×2 IMPLANT
OBTURATOR OPTICALSTD 8 DVNC (TROCAR) ×2 IMPLANT
PACK LAP CHOLECYSTECTOMY (MISCELLANEOUS) ×2 IMPLANT
SCISSORS MNPLR CVD DVNC XI (INSTRUMENTS) ×2 IMPLANT
SEAL UNIV 5-12 XI (MISCELLANEOUS) ×6 IMPLANT
SET TUBE SMOKE EVAC HIGH FLOW (TUBING) ×2 IMPLANT
SOLN STERILE WATER 500 ML (IV SOLUTION) ×2 IMPLANT
SOLUTION ELECTROSURG ANTI STCK (MISCELLANEOUS) ×2 IMPLANT
SUT MNCRL AB 4-0 PS2 18 (SUTURE) ×2 IMPLANT
SUT STRATA 3-0 30 PS-1 (SUTURE) IMPLANT
SUT STRATA 3-0 SH (SUTURE) IMPLANT
SUT VIC AB 3-0 SH 27X BRD (SUTURE) IMPLANT
SUT VICRYL 0 UR6 27IN ABS (SUTURE) ×2 IMPLANT
SUTURE STRATFX 0 PDS+ CT-2 23 (SUTURE) ×2 IMPLANT
SYR 30ML LL (SYRINGE) ×2 IMPLANT
SYSTEM WECK SHIELD CLOSURE (TROCAR) IMPLANT
TRAY FOLEY MTR SLVR 16FR STAT (SET/KITS/TRAYS/PACK) ×2 IMPLANT

## 2024-11-09 NOTE — Discharge Instructions (Signed)
 Hernia repair, Care After ?This sheet gives you information about how to care for yourself after your procedure. Your health care provider may also give you more specific instructions. If you have problems or questions, contact your health care provider. ?What can I expect after the procedure? ?After your procedure, it is common to have the following: ?Pain in your abdomen, especially in the incision areas. You will be given medicine to control the pain. ?Tiredness. This is a normal part of the recovery process. Your energy level will return to normal over the next several weeks. ?Changes in your bowel movements, such as constipation or needing to go more often. Talk with your health care provider about how to manage this. ?Follow these instructions at home: ?Medicines ? tylenol and advil as needed for discomfort.  Please alternate between the two every four hours as needed for pain.   ? Use narcotics, if prescribed, only when tylenol and motrin is not enough to control pain. ? 325-650mg  every 8hrs to max of 3000mg /24hrs (including the 325mg  in every norco dose) for the tylenol.   ? Advil up to 800mg  per dose every 8hrs as needed for pain.   ?PLEASE RECORD NUMBER OF PILLS TAKEN UNTIL NEXT FOLLOW UP APPT.  THIS WILL HELP DETERMINE HOW READY YOU ARE TO BE RELEASED FROM ANY ACTIVITY RESTRICTIONS ?Do not drive or use heavy machinery while taking prescription pain medicine. ?Do not drink alcohol while taking prescription pain medicine. ? ?Incision care ? ?  ?Follow instructions from your health care provider about how to take care of your incision areas. Make sure you: ?Keep your incisions clean and dry. ?Wash your hands with soap and water before and after applying medicine to the areas, and before and after changing your bandage (dressing). If soap and water are not available, use hand sanitizer. ?Change your dressing as told by your health care provider. ?Leave stitches (sutures), skin glue, or adhesive strips in  place. These skin closures may need to stay in place for 2 weeks or longer. If adhesive strip edges start to loosen and curl up, you may trim the loose edges. Do not remove adhesive strips completely unless your health care provider tells you to do that. ?Do not wear tight clothing over the incisions. Tight clothing may rub and irritate the incision areas, which may cause the incisions to open. ?Do not take baths, swim, or use a hot tub until your health care provider approves. OK TO SHOWER IN 24HRS.   ?Check your incision area every day for signs of infection. Check for: ?More redness, swelling, or pain. ?More fluid or blood. ?Warmth. ?Pus or a bad smell. ?Activity ?Avoid lifting anything that is heavier than 10 lb (4.5 kg) for 2 weeks or until your health care provider says it is okay. ?No pushing/pulling greater than 30lbs ?You may resume normal activities as told by your health care provider. Ask your health care provider what activities are safe for you. ?Take rest breaks during the day as needed. ?Eating and drinking ?Follow instructions from your health care provider about what you can eat after surgery. ?To prevent or treat constipation while you are taking prescription pain medicine, your health care provider may recommend that you: ?Drink enough fluid to keep your urine clear or pale yellow. ?Take over-the-counter or prescription medicines. ?Eat foods that are high in fiber, such as fresh fruits and vegetables, whole grains, and beans. ?Limit foods that are high in fat and processed sugars, such as fried and  sweet foods. ?General instructions ?Ask your health care provider when you will need an appointment to get your sutures or staples removed. ?Keep all follow-up visits as told by your health care provider. This is important. ?Contact a health care provider if: ?You have more redness, swelling, or pain around your incisions. ?You have more fluid or blood coming from the incisions. ?Your incisions feel  warm to the touch. ?You have pus or a bad smell coming from your incisions or your dressing. ?You have a fever. ?You have an incision that breaks open (edges not staying together) after sutures or staples have been removed. ?You develop a rash. ?You have chest pain or difficulty breathing. ?You have pain or swelling in your legs. ?You feel light-headed or you faint. ?Your abdomen swells (becomes distended). ?You have nausea or vomiting. ?You have blood in your stool (feces). ?This information is not intended to replace advice given to you by your health care provider. Make sure you discuss any questions you have with your health care provider. ?Document Released: 05/28/2005 Document Revised: 07/28/2018 Document Reviewed: 08/09/2016 ?Elsevier Interactive Patient Education ? 2019 [employer] ?  ? ?

## 2024-11-09 NOTE — Anesthesia Postprocedure Evaluation (Signed)
"   Anesthesia Post Note  Patient: Donald Henson  Procedure(s) Performed: REPAIR, HERNIA, UMBILICAL, ROBOT-ASSISTED INSERTION OF MESH  Patient location during evaluation: PACU Anesthesia Type: General Level of consciousness: awake and alert Pain management: pain level controlled Vital Signs Assessment: post-procedure vital signs reviewed and stable Respiratory status: spontaneous breathing, nonlabored ventilation, respiratory function stable and patient connected to nasal cannula oxygen Cardiovascular status: blood pressure returned to baseline and stable Postop Assessment: no apparent nausea or vomiting Anesthetic complications: no   No notable events documented.   Last Vitals:  Vitals:   11/09/24 0813 11/09/24 1051  BP: 133/85 119/74  Pulse: 86 88  Resp: 17 (!) 29  Temp: (!) 36.1 C 36.9 C  SpO2: 98% (!) 65%    Last Pain:  Vitals:   11/09/24 1116  TempSrc:   PainSc: 6                  [facility]      "

## 2024-11-09 NOTE — Interval H&P Note (Signed)
 History and Physical Interval Note:  11/09/2024 8:54 AM  Donald Henson  has presented today for surgery, with the diagnosis of Umbilical hernia with obstruction but no gangrene K42.0.  The various methods of treatment have been discussed with the patient and family. After consideration of risks, benefits and other options for treatment, the patient has consented to  Procedures: REPAIR, HERNIA, UMBILICAL, ROBOT-ASSISTED (N/A) as a surgical intervention.  The patient's history has been reviewed, patient examined, no change in status, stable for surgery.  I have reviewed the patient's chart and labs.  Questions were answered to the patient's satisfaction.     Donald Henson

## 2024-11-09 NOTE — Interval H&P Note (Signed)
 No change. OK to proceed.

## 2024-11-09 NOTE — Op Note (Signed)
 Preoperative diagnosis: Umbilical, incarcerated, initial hernia Postoperative diagnosis: same  Procedure: Robotic assisted laparoscopic umbilical hernia repair with mesh  Anesthesia: general  Surgeon: Henriette Pierre  Wound Classification: Clean  Specimen: none  Complications: None  Estimated Blood Loss: 10ml  Indications:see HPI  Findings: Incarcerated umbilical hernia defect measuring 2 cm x 2 cm 2. Tension free repair achieved with ProGrip mesh and suture 3. Adequate hemostasis  Description of procedure: The patient was brought to the operating room and general anesthesia was induced. A time-out was completed verifying correct patient, procedure, site, positioning, and implant(s) and/or special equipment prior to beginning this procedure. Antibiotics were administered prior to making the incision. SCDs placed. The anterior abdominal wall was prepped and draped in the standard sterile fashion.   Palmer's point chosen for entry.  Veress needle placed and abdomen insufflated to 15cm without any dramatic increase in pressure.  Needle removed and a 8 mm port placed lateral to the umbilicus on the left side via Optiview technique.  No bowel injury noted.   additional ports, 8mm and 12mm, along left lateral aspect placed.  Exparel  used as tap block under direct visualization.   Xi robot then docked into place.  Moderate amount of omental contents known to be incarcerated in the defect.  This was gently reduced using a combination of electrocautery and manual retraction.  Umbilical hernia defect measuring 2 cm x 2 cm was noted.  Preperitoneal plane was entered by making a incision along the left lateral aspect of the peritoneum.  This flap was carried across the abdomen to the other side of the defect, reducing all hernia contents and preperitoneal lipoma within the hernia defect.  Small amounts of bleeding was controlled with cautery.  Once adequate exposure of the defect and adequate space was  created to place the mesh, insufflation dropped to 8mm and transfacial suture with 0 stratafix used to primarily close defect under minimal tension.  Overlying skin was tacked with suture to secure umbilical stalk to fascia.   ProGrip mesh cut to size with adequate overlap around the defect edges was placed within the abdominal cavity through 12mm port and secured to the abdominal wall centered over the defect. The peritoneal flap was then closed with a running 3-0 V lock.  Robot was undocked.  The 12mm cannula was removed and port site was closed using PMI device and 0 vicryl suture, ensuring no bowels were injured during this process.  Abdomen then desufflated while camera within abdomen to ensure no signs of new bleed prior to removing camera and rest of ports completely.  All skin incisions closed with runninrg 4-0 Monocryl in a subcuticular fashion.  All wounds then dressed with Dermabond.  Patient was then successfully awakened and transferred to PACU in stable condition.  At the end of the procedure sponge and instrument counts were correct.

## 2024-11-09 NOTE — Anesthesia Procedure Notes (Signed)
 Procedure Name: Intubation Date/Time: 11/09/2024 9:18 AM  Performed by: Hersey Maclellan, CRNAPre-anesthesia Checklist: Patient identified, Emergency Drugs available, Suction available and Patient being monitored Patient Re-evaluated:Patient Re-evaluated prior to induction Oxygen Delivery Method: Circle System Utilized Preoxygenation: Pre-oxygenation with 100% oxygen Induction Type: IV induction Ventilation: Mask ventilation without difficulty, Two handed mask ventilation required and Oral airway inserted - appropriate to patient size Laryngoscope Size: Glidescope and 4 Grade View: Grade I Tube type: Oral Tube size: 7.5 mm Number of attempts: 1 Airway Equipment and Method: Stylet and Oral airway Placement Confirmation: ETT inserted through vocal cords under direct vision, positive ETCO2 and breath sounds checked- equal and bilateral Secured at: 23 cm Tube secured with: Tape Dental Injury: Teeth and Oropharynx as per pre-operative assessment  Comments: Easy, atraumatic intubation. Lips, teeth, and tongue unchanged. Head and neck midline.

## 2024-11-09 NOTE — Transfer of Care (Signed)
 Immediate Anesthesia Transfer of Care Note  Patient: Donald Henson  Procedure(s) Performed: REPAIR, HERNIA, UMBILICAL, ROBOT-ASSISTED INSERTION OF MESH  Patient Location: PACU  Anesthesia Type:General  Level of Consciousness: awake, alert , and oriented  Airway & Oxygen Therapy: Patient Spontanous Breathing and Patient connected to face mask oxygen  Post-op Assessment: Report given to RN and Post -op Vital signs reviewed and stable  Post vital signs: Reviewed and stable  Last Vitals:  Vitals Value Taken Time  BP 119/74 11/09/24 10:51  Temp    Pulse 88 11/09/24 10:54  Resp 21 11/09/24 10:54  SpO2 95 % 11/09/24 10:54  Vitals shown include unfiled device data.  Last Pain:  Vitals:   11/09/24 0813  TempSrc: Temporal  PainSc: 0-No pain         Complications: No notable events documented.

## 2024-11-09 NOTE — Anesthesia Preprocedure Evaluation (Signed)
"                                    Anesthesia Evaluation  Patient identified by MRN, date of birth, ID band Patient awake    Reviewed: Allergy & Precautions, NPO status , Patient's Chart, lab work & pertinent test results  Airway Mallampati: III  TM Distance: >3 FB Neck ROM: full    Dental  (+) Chipped   Pulmonary Current Smoker and Patient abstained from smoking.   Pulmonary exam normal        Cardiovascular hypertension, On Medications negative cardio ROS Normal cardiovascular exam     Neuro/Psych negative neurological ROS  negative psych ROS   GI/Hepatic negative GI ROS, Neg liver ROS,,,  Endo/Other  negative endocrine ROS    Renal/GU      Musculoskeletal   Abdominal   Peds  Hematology negative hematology ROS (+)   Anesthesia Other Findings Past Medical History: No date: Elevated cholesterol No date: Hypertension  Past Surgical History: No date: TONSILLECTOMY  BMI    Body Mass Index: 31.93 kg/m      Reproductive/Obstetrics negative OB ROS                              Anesthesia Physical Anesthesia Plan  ASA: 2  Anesthesia Plan: General ETT   Post-op Pain Management:    Induction: Intravenous  PONV Risk Score and Plan: 3 and Ondansetron , Dexamethasone  and Midazolam   Airway Management Planned: Oral ETT  Additional Equipment:   Intra-op Plan:   Post-operative Plan: Extubation in OR  Informed Consent: I have reviewed the patients History and Physical, chart, labs and discussed the procedure including the risks, benefits and alternatives for the proposed anesthesia with the patient or authorized representative who has indicated his/her understanding and acceptance.     Dental Advisory Given  Plan Discussed with: Anesthesiologist, CRNA and Surgeon  Anesthesia Plan Comments: (Patient consented for risks of anesthesia including but not limited to:  - adverse reactions to medications - damage to  eyes, teeth, lips or other oral mucosa - nerve damage due to positioning  - sore throat or hoarseness - Damage to heart, brain, nerves, lungs, other parts of body or loss of life  Patient voiced understanding and assent.)        Anesthesia Quick Evaluation  "

## 2024-11-12 ENCOUNTER — Encounter: Payer: Self-pay | Admitting: Surgery

## 2024-11-26 ENCOUNTER — Other Ambulatory Visit: Payer: Self-pay
